# Patient Record
Sex: Female | Born: 1995 | Race: White | Hispanic: No | Marital: Single | Smoking: Never smoker
Health system: Southern US, Community
[De-identification: ages and names within clinical notes are randomized; demographics above are authoritative.]

## PROBLEM LIST (undated history)

## (undated) DIAGNOSIS — T7840XA Allergy, unspecified, initial encounter: Secondary | ICD-10-CM

## (undated) DIAGNOSIS — K589 Irritable bowel syndrome without diarrhea: Secondary | ICD-10-CM

## (undated) HISTORY — DX: Allergy, unspecified, initial encounter: T78.40XA

## (undated) HISTORY — DX: Irritable bowel syndrome, unspecified: K58.9

---
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
## 2015-01-01 DIAGNOSIS — E669 Obesity, unspecified: Secondary | ICD-10-CM | POA: Insufficient documentation

## 2015-01-01 DIAGNOSIS — Z0000 Encounter for general adult medical examination without abnormal findings: Secondary | ICD-10-CM | POA: Insufficient documentation

## 2015-01-01 DIAGNOSIS — K580 Irritable bowel syndrome with diarrhea: Secondary | ICD-10-CM | POA: Insufficient documentation

## 2015-02-05 HISTORY — PX: WISDOM TOOTH EXTRACTION: SHX21

## 2015-06-20 ENCOUNTER — Ambulatory Visit (INDEPENDENT_AMBULATORY_CARE_PROVIDER_SITE_OTHER): Payer: BC Managed Care – PPO | Admitting: Family Medicine

## 2015-06-20 ENCOUNTER — Encounter: Payer: Self-pay | Admitting: Family Medicine

## 2015-06-20 VITALS — BP 100/62 | HR 70 | Ht 63.0 in | Wt 206.0 lb

## 2015-06-20 DIAGNOSIS — K580 Irritable bowel syndrome with diarrhea: Secondary | ICD-10-CM

## 2015-06-20 MED ORDER — DICYCLOMINE HCL 20 MG PO TABS: 20.0000 mg | ORAL_TABLET | Freq: Three times a day (TID) | ORAL | Status: AC

## 2015-06-20 NOTE — Progress Notes (Signed)
Name: Tina Davenport   MRN: [id number]    DOB: 06/11/1996   Date:06/20/2015       Progress Note  Subjective  Chief Complaint  Chief Complaint  Patient presents with  . Diarrhea    Bentyl was controlling IBS symptoms but has since stopped working- been having diarrhea once a day for a couple of months    Diarrhea  This is a recurrent problem. The current episode started more than 1 month ago. The problem occurs 2 to 4 times per day. The problem has been gradually worsening. The stool consistency is described as mucous. The patient states that diarrhea does not awaken her from sleep. Associated symptoms include abdominal pain, bloating and increased flatus. Pertinent negatives include no chills, coughing, fever, headaches, myalgias, sweats or weight loss. The symptoms are aggravated by dairy products and stress. She has tried anti-motility drug for the symptoms. The treatment provided mild (diarrhea remed on present dosage) relief. Her past medical history is significant for irritable bowel syndrome. There is no history of bowel resection, inflammatory bowel disease or malabsorption.    No problem-specific assessment & plan notes found for this encounter.   Past Medical History  Diagnosis Date  . Allergy   . Irritable bowel syndrome     History reviewed. No pertinent past surgical history.  Family History  Problem Relation Age of Onset  . Diabetes Mother   . Diabetes Father     Social History   Social History  . Marital Status: Single    Spouse Name: N/A  . Number of Children: N/A  . Years of Education: N/A   Occupational History  . Not on file.   Social History Main Topics  . Smoking status: Never Smoker   . Smokeless tobacco: Not on file  . Alcohol Use: No  . Drug Use: No  . Sexual Activity: Yes   Other Topics Concern  . Not on file   Social History Narrative    No Known Allergies   Review of Systems  Constitutional: Negative for fever, chills, weight loss  and malaise/fatigue.  HENT: Negative for ear discharge, ear pain and sore throat.   Eyes: Negative for blurred vision.  Respiratory: Negative for cough, sputum production, shortness of breath and wheezing.   Cardiovascular: Negative for chest pain, palpitations and leg swelling.  Gastrointestinal: Positive for abdominal pain, diarrhea, bloating and flatus. Negative for heartburn, nausea, constipation, blood in stool and melena.  Genitourinary: Negative for dysuria, urgency, frequency and hematuria.  Musculoskeletal: Negative for myalgias, back pain, joint pain and neck pain.  Skin: Negative for rash.  Neurological: Negative for dizziness, tingling, sensory change, focal weakness and headaches.  Endo/Heme/Allergies: Negative for environmental allergies and polydipsia. Does not bruise/bleed easily.  Psychiatric/Behavioral: Negative for depression and suicidal ideas. The patient is not nervous/anxious and does not have insomnia.      Objective  Filed Vitals:   06/20/15 1342  BP: 100/62  Pulse: 70  Height: 5\' 3"  (1.6 m)  Weight: 206 lb (93.441 kg)    Physical Exam  Constitutional: She is well-developed, well-nourished, and in no distress. No distress.  HENT:  Head: Normocephalic and atraumatic.  Right Ear: External ear normal.  Left Ear: External ear normal.  Nose: Nose normal.  Mouth/Throat: Oropharynx is clear and moist.  Eyes: Conjunctivae and EOM are normal. Pupils are equal, round, and reactive to light. Right eye exhibits no discharge. Left eye exhibits no discharge.  Neck: Normal range of motion. Neck supple. No  JVD present. No thyromegaly present.  Cardiovascular: Normal rate, regular rhythm, normal heart sounds and intact distal pulses.  Exam reveals no gallop and no friction rub.   No murmur heard. Pulmonary/Chest: Effort normal and breath sounds normal.  Abdominal: Soft. Bowel sounds are normal. She exhibits no mass. There is no tenderness. There is no guarding.   Musculoskeletal: Normal range of motion. She exhibits no edema.  Lymphadenopathy:    She has no cervical adenopathy.  Neurological: She is alert.  Skin: Skin is warm and dry. She is not diaphoretic.  Psychiatric: Mood and affect normal.  Nursing note and vitals reviewed.     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 Elizabeth Sauer [facility]  06/20/2015

## 2015-08-18 ENCOUNTER — Encounter: Payer: Self-pay | Admitting: Gastroenterology

## 2015-08-18 ENCOUNTER — Ambulatory Visit (INDEPENDENT_AMBULATORY_CARE_PROVIDER_SITE_OTHER): Payer: BC Managed Care – PPO | Admitting: Gastroenterology

## 2015-08-18 VITALS — BP 124/70 | HR 112 | Temp 99.2°F | Ht 62.0 in | Wt 206.0 lb

## 2015-08-18 DIAGNOSIS — K580 Irritable bowel syndrome with diarrhea: Secondary | ICD-10-CM

## 2015-08-18 NOTE — Progress Notes (Signed)
Gastroenterology Consultation  Referring Provider:     Duanne LimerickJones, Deanna C, MD Primary Care Physician:  Tina Sauereanna Jones, MD Primary Gastroenterologist:  Dr. Servando SnareWohl     Reason for Consultation:     Diarrhea        HPI:   Tina Davenport is a 19 y.o. y/o female referred for consultation & management of diarrhea by Dr. Elizabeth Sauereanna Jones, MD.  This patient comes today with a history of diarrhea after she eats the last 4 years. The patient has had no rectal bleeding with the diarrhea she does report that she has crampy abdominal pain is better with the moving of her bowels. The patient also reports that the diarrhea does not wake her up at night. She eats and has to go to the bathroom shortly after that. She was tried on dicyclomine 10 mg 3 times a day and states that did not help her symptoms. The patient was then put on 20 mg 3 times a day and states that she had to stop that because she became constipated. She does not report that any certain foods make her any more clear to have diarrhea than anything else. She states it can also happen with just water.  Past Medical History  Diagnosis Date  . Allergy   . Irritable bowel syndrome     Past Surgical History  Procedure Laterality Date  . Wisdom tooth extraction  02/2015    Prior to Admission medications   Medication Sig Start Date End Date Taking? Authorizing Provider  cetirizine (ZYRTEC) 10 MG tablet Take 10 mg by mouth daily. OTC   Yes Historical Provider, MD  dicyclomine (BENTYL) 20 MG tablet Take 1 tablet (20 mg total) by mouth 3 (three) times daily. 06/20/15  Yes Duanne Limerickeanna C Jones, MD  drospirenone-ethinyl estradiol (YAZ,GIANVI,LORYNA) 3-0.02 MG tablet Take 1 tablet by mouth daily. 09/09/14  Yes Historical Provider, MD    Family History  Problem Relation Age of Onset  . Diabetes Mother   . Diabetes Father      Social History  Substance Use Topics  . Smoking status: Never Smoker   . Smokeless tobacco: Never Used  . Alcohol Use: No     Allergies as of 08/18/2015  . (No Known Allergies)    Review of Systems:    All systems reviewed and negative except where noted in HPI.   Physical Exam:  BP 124/70 mmHg  Pulse 112  Temp(Src) 99.2 F (37.3 C) (Oral)  Ht 5\' 2"  (1.575 m)  Wt 206 lb (93.441 kg)  BMI 37.67 kg/m2 No LMP recorded. Psych:  Alert and cooperative. Normal mood and affect. General:   Alert,  Well-developed, obese, well-nourished, pleasant and cooperative in NAD Head:  Normocephalic and atraumatic. Eyes:  Sclera clear, no icterus.   Conjunctiva pink. Ears:  Normal auditory acuity. Nose:  No deformity, discharge, or lesions. Mouth:  No deformity or lesions,oropharynx pink & moist. Neck:  Supple; no masses or thyromegaly. Lungs:  Respirations even and unlabored.  Clear throughout to auscultation.   No wheezes, crackles, or rhonchi. No acute distress. Heart:  Regular rate and rhythm; no murmurs, clicks, rubs, or gallops. Abdomen:  Normal bowel sounds.  No bruits.  Soft, non-tender and non-distended without masses, hepatosplenomegaly or hernias noted.  No guarding or rebound tenderness.  Negative Carnett sign.   Rectal:  Deferred.  Msk:  Symmetrical without gross deformities.  Good, equal movement & strength bilaterally. Pulses:  Normal pulses noted. Extremities:  No clubbing or  edema.  No cyanosis. Neurologic:  Alert and oriented x3;  grossly normal neurologically. Skin:  Intact without significant lesions or rashes.  No jaundice. Lymph Nodes:  No significant cervical adenopathy. Psych:  Alert and cooperative. Normal mood and affect.  Imaging Studies: No results found.  Assessment and Plan:   LAKEYSHIA TUCKERMAN is a 19 y.o. y/o female who has 4 years of diarrhea and likely has irritable bowel syndrome with diarrhea predominance. The patient has no worrisome symptoms such as weight loss, family history of colon cancer, rectal bleeding or stools waking her up from her sleep. The patient has been tried on  dicyclomine but when she took the low dose it did nothing and when she took the higher dose she states she got constipated. The patient has been told to titrate the medication to effect. She has been told to try 10 mg twice a day with 20 mg once a day and if that improves her symptoms. If the patient does not find accommodation that works for her she may need to be tried on [employer]. The patient has been explained the plan and agrees with it.   Note: This dictation was prepared with Dragon dictation along with smaller phrase technology. Any transcriptional errors that result from this process are unintentional.

## 2015-09-19 ENCOUNTER — Encounter: Payer: Self-pay | Admitting: Family Medicine

## 2015-09-19 ENCOUNTER — Ambulatory Visit (INDEPENDENT_AMBULATORY_CARE_PROVIDER_SITE_OTHER): Payer: BC Managed Care – PPO | Admitting: Family Medicine

## 2015-09-19 VITALS — BP 120/70 | HR 60 | Ht 62.0 in | Wt 208.0 lb

## 2015-09-19 DIAGNOSIS — Z3041 Encounter for surveillance of contraceptive pills: Secondary | ICD-10-CM

## 2015-09-19 DIAGNOSIS — N926 Irregular menstruation, unspecified: Secondary | ICD-10-CM

## 2015-09-19 MED ORDER — DROSPIRENONE-ETHINYL ESTRADIOL 3-0.02 MG PO TABS: 1.0000 | ORAL_TABLET | Freq: Every day | ORAL | Status: DC

## 2015-09-19 NOTE — Progress Notes (Signed)
Name: Tina Davenport   MRN: [id number]    DOB: 1995/10/01   Date:09/19/2015       Progress Note  Subjective  Chief Complaint  Chief Complaint  Patient presents with  . Contraception    needs refill on YAZ    Gynecologic Exam The patient's pertinent negatives include no genital itching, genital lesions, genital odor, genital rash, missed menses, pelvic pain, vaginal bleeding or vaginal discharge. Primary symptoms comment: ocp maintenance. This is a recurrent problem. The current episode started more than 1 year ago. Pertinent negatives include no abdominal pain, back pain, chills, constipation, diarrhea, dysuria, fever, frequency, headaches, hematuria, joint pain, nausea, rash, sore throat or urgency. The treatment provided moderate (periods) relief. She is sexually active. She uses oral contraceptives for contraception. Her menstrual history has been regular.    No problem-specific assessment & plan notes found for this encounter.   Past Medical History  Diagnosis Date  . Allergy   . Irritable bowel syndrome     Past Surgical History  Procedure Laterality Date  . Wisdom tooth extraction  02/2015    Family History  Problem Relation Age of Onset  . Diabetes Mother   . Diabetes Father     Social History   Social History  . Marital Status: Single    Spouse Name: N/A  . Number of Children: N/A  . Years of Education: N/A   Occupational History  . Not on file.   Social History Main Topics  . Smoking status: Never Smoker   . Smokeless tobacco: Never Used  . Alcohol Use: No  . Drug Use: No  . Sexual Activity: Yes   Other Topics Concern  . Not on file   Social History Narrative    No Known Allergies   Review of Systems  Constitutional: Negative for fever, chills, weight loss and malaise/fatigue.  HENT: Negative for ear discharge, ear pain and sore throat.   Eyes: Negative for blurred vision.  Respiratory: Negative for cough, sputum production, shortness of  breath and wheezing.   Cardiovascular: Negative for chest pain, palpitations and leg swelling.  Gastrointestinal: Negative for heartburn, nausea, abdominal pain, diarrhea, constipation, blood in stool and melena.  Genitourinary: Negative for dysuria, urgency, frequency, hematuria, vaginal discharge, pelvic pain and missed menses.  Musculoskeletal: Negative for myalgias, back pain, joint pain and neck pain.  Skin: Negative for rash.  Neurological: Negative for dizziness, tingling, sensory change, focal weakness and headaches.  Endo/Heme/Allergies: Negative for environmental allergies and polydipsia. Does not bruise/bleed easily.  Psychiatric/Behavioral: Negative for depression and suicidal ideas. The patient is not nervous/anxious and does not have insomnia.      Objective  Filed Vitals:   09/19/15 1019  BP: 120/70  Pulse: 60  Height: 5\' 2"  (1.575 m)  Weight: 208 lb (94.348 kg)    Physical Exam  Constitutional: She is well-developed, well-nourished, and in no distress. No distress.  HENT:  Head: Normocephalic and atraumatic.  Right Ear: External ear normal.  Left Ear: External ear normal.  Nose: Nose normal.  Mouth/Throat: Oropharynx is clear and moist.  Eyes: Conjunctivae and EOM are normal. Pupils are equal, round, and reactive to light. Right eye exhibits no discharge. Left eye exhibits no discharge.  Neck: Normal range of motion. Neck supple. No JVD present. No thyromegaly present.  Cardiovascular: Normal rate, regular rhythm, normal heart sounds and intact distal pulses.  Exam reveals no gallop and no friction rub.   No murmur heard. Pulmonary/Chest: Effort normal and breath sounds  normal.  Abdominal: Soft. Bowel sounds are normal. She exhibits no mass. There is no tenderness. There is no guarding.  Musculoskeletal: Normal range of motion. She exhibits no edema.  Lymphadenopathy:    She has no cervical adenopathy.  Neurological: She is alert. She has normal reflexes.   Skin: Skin is warm and dry. She is not diaphoretic.  Psychiatric: Mood and affect normal.  Nursing note and vitals reviewed.     Assessment & Plan  Problem List Items Addressed This Visit    None    Visit Diagnoses    Irregular periods/menstrual cycles    -  Primary    Relevant Medications    drospirenone-ethinyl estradiol (YAZ,GIANVI,LORYNA) 3-0.02 MG tablet    Encounter for surveillance of contraceptive pills        Relevant Medications    drospirenone-ethinyl estradiol (YAZ,GIANVI,LORYNA) 3-0.02 MG tablet         Dr. Hayden Rasmusseneanna Jones [facility]  09/19/2015

## 2016-09-01 ENCOUNTER — Other Ambulatory Visit: Payer: Self-pay | Admitting: Family Medicine

## 2016-09-01 DIAGNOSIS — Z3041 Encounter for surveillance of contraceptive pills: Secondary | ICD-10-CM

## 2016-09-01 DIAGNOSIS — N926 Irregular menstruation, unspecified: Secondary | ICD-10-CM

## 2016-09-29 ENCOUNTER — Other Ambulatory Visit: Payer: Self-pay | Admitting: Family Medicine

## 2016-09-29 DIAGNOSIS — Z3041 Encounter for surveillance of contraceptive pills: Secondary | ICD-10-CM

## 2016-09-29 DIAGNOSIS — N926 Irregular menstruation, unspecified: Secondary | ICD-10-CM

## 2016-09-29 NOTE — Telephone Encounter (Signed)
Mother called and requested [phone] [place]. Daughter in college and can not get home for CPE until  2-3 months. Please call mom if this is a problem [phone].
